# Patient Record
Sex: Male | Born: 1975 | Race: White | Hispanic: No | Marital: Married | Smoking: Never smoker
Health system: Southern US, Community
[De-identification: ages and names within clinical notes are randomized; demographics above are authoritative.]

## PROBLEM LIST (undated history)

## (undated) DIAGNOSIS — R079 Chest pain, unspecified: Secondary | ICD-10-CM

## (undated) DIAGNOSIS — I10 Essential (primary) hypertension: Secondary | ICD-10-CM

## (undated) HISTORY — PX: TONSILLECTOMY: SUR1361

## (undated) HISTORY — PX: COLONOSCOPY: SHX174

## (undated) HISTORY — PX: TONSILECTOMY, ADENOIDECTOMY, BILATERAL MYRINGOTOMY AND TUBES: SHX2538

## (undated) HISTORY — PX: OTHER SURGICAL HISTORY: SHX169

## (undated) HISTORY — PX: HYPOSPADIAS CORRECTION: SHX483

---

## 2014-03-20 ENCOUNTER — Emergency Department (HOSPITAL_BASED_OUTPATIENT_CLINIC_OR_DEPARTMENT_OTHER): Payer: Managed Care, Other (non HMO)

## 2014-03-20 ENCOUNTER — Encounter (HOSPITAL_BASED_OUTPATIENT_CLINIC_OR_DEPARTMENT_OTHER): Payer: Self-pay | Admitting: Emergency Medicine

## 2014-03-20 ENCOUNTER — Emergency Department (HOSPITAL_BASED_OUTPATIENT_CLINIC_OR_DEPARTMENT_OTHER)
Admission: EM | Admit: 2014-03-20 | Discharge: 2014-03-20 | Disposition: A | Discharge status: Home / Self Care | Payer: Managed Care, Other (non HMO) | Attending: Emergency Medicine | Admitting: Emergency Medicine

## 2014-03-20 DIAGNOSIS — X58XXXA Exposure to other specified factors, initial encounter: Secondary | ICD-10-CM | POA: Insufficient documentation

## 2014-03-20 DIAGNOSIS — S6990XA Unspecified injury of unspecified wrist, hand and finger(s), initial encounter: Secondary | ICD-10-CM

## 2014-03-20 DIAGNOSIS — S59909A Unspecified injury of unspecified elbow, initial encounter: Secondary | ICD-10-CM | POA: Diagnosis present

## 2014-03-20 DIAGNOSIS — S63501A Unspecified sprain of right wrist, initial encounter: Secondary | ICD-10-CM

## 2014-03-20 DIAGNOSIS — Y929 Unspecified place or not applicable: Secondary | ICD-10-CM | POA: Insufficient documentation

## 2014-03-20 DIAGNOSIS — Y9389 Activity, other specified: Secondary | ICD-10-CM | POA: Diagnosis not present

## 2014-03-20 DIAGNOSIS — S63509A Unspecified sprain of unspecified wrist, initial encounter: Secondary | ICD-10-CM | POA: Diagnosis not present

## 2014-03-20 DIAGNOSIS — I10 Essential (primary) hypertension: Secondary | ICD-10-CM | POA: Insufficient documentation

## 2014-03-20 DIAGNOSIS — S59919A Unspecified injury of unspecified forearm, initial encounter: Secondary | ICD-10-CM

## 2014-03-20 HISTORY — DX: Essential (primary) hypertension: I10

## 2014-03-20 NOTE — ED Provider Notes (Signed)
CSN: [id number]     Arrival date & time 03/20/14  1620 History   First MD Initiated Contact with Patient 03/20/14 1747     Chief Complaint  Patient presents with  . Wrist Pain     (Consider location/radiation/quality/duration/timing/severity/associated sxs/prior Treatment) Patient is a 38 y.o. male presenting with wrist pain. The history is provided by the patient.  Wrist Pain This is a new problem. The current episode started today. The problem occurs constantly. The problem has been gradually worsening. Associated symptoms include joint swelling and myalgias. Pertinent negatives include no numbness. Nothing aggravates the symptoms. He has tried nothing for the symptoms. The treatment provided moderate relief.    Past Medical History  Diagnosis Date  . Hypertension    History reviewed. No pertinent past surgical history. No family history on file. History  Substance Use Topics  . Smoking status: Never Smoker   . Smokeless tobacco: Not on file  . Alcohol Use: No    Review of Systems  Musculoskeletal: Positive for joint swelling and myalgias.  Neurological: Negative for numbness.  All other systems reviewed and are negative.     Allergies  Review of patient's allergies indicates no known allergies.  Home Medications   Prior to Admission medications   Not on File   Pulse 74  Temp(Src) 98.3 F (36.8 C)  Resp 16  Ht 6' (1.829 m)  Wt 230 lb (104.327 kg)  BMI 31.19 kg/m2  SpO2 100% Physical Exam  Nursing note and vitals reviewed. Constitutional: He appears well-developed and well-nourished.  HENT:  Head: Normocephalic and atraumatic.  Musculoskeletal: He exhibits tenderness.  Swelling to right wrist ulna aspect.  Decreased range of motion,  nv and ns intact  Neurological: He is alert.  Skin: Skin is warm.  Psychiatric: He has a normal mood and affect.    ED Course  Procedures (including critical care time) Labs Review Labs Reviewed - No data to  display  Imaging Review Dg Wrist Complete Right  03/20/2014   CLINICAL DATA:  Right wrist injury and pain.  EXAM: RIGHT WRIST - COMPLETE 3+ VIEW  COMPARISON:  None.  FINDINGS: There is no evidence of fracture or dislocation. There is no evidence of arthropathy or other focal bone abnormality. Soft tissues are unremarkable.  IMPRESSION: Negative.   Electronically Signed   By: Laveda Abbe M.D.   On: 03/20/2014 16:41     EKG Interpretation None      MDM   Final diagnoses:  Wrist sprain, right, initial encounter    Wrist splint Ibuprofen Follow up with Dr. Janee Morn for recheck in 1 week.    Lonia Skinner [place], PA-C 03/20/14 1816

## 2014-03-20 NOTE — ED Provider Notes (Signed)
Medical screening examination/treatment/procedure(s) were performed by non-physician practitioner and as supervising physician I was immediately available for consultation/collaboration.   EKG Interpretation None        Elwin Mocha, MD 03/20/14 2355

## 2014-03-20 NOTE — ED Notes (Signed)
Pt reports pain and swelling to right wrist. Reports catching himself on fall

## 2014-03-20 NOTE — Discharge Instructions (Signed)

## 2016-06-03 ENCOUNTER — Emergency Department (HOSPITAL_BASED_OUTPATIENT_CLINIC_OR_DEPARTMENT_OTHER): Payer: Managed Care, Other (non HMO)

## 2016-06-03 ENCOUNTER — Observation Stay (HOSPITAL_BASED_OUTPATIENT_CLINIC_OR_DEPARTMENT_OTHER)
Admission: EM | Admit: 2016-06-03 | Discharge: 2016-06-04 | Disposition: A | Discharge status: Home / Self Care | Payer: Managed Care, Other (non HMO) | Attending: Internal Medicine | Admitting: Internal Medicine

## 2016-06-03 ENCOUNTER — Encounter (HOSPITAL_BASED_OUTPATIENT_CLINIC_OR_DEPARTMENT_OTHER): Payer: Self-pay | Admitting: Emergency Medicine

## 2016-06-03 DIAGNOSIS — E785 Hyperlipidemia, unspecified: Secondary | ICD-10-CM

## 2016-06-03 DIAGNOSIS — Z7982 Long term (current) use of aspirin: Secondary | ICD-10-CM | POA: Insufficient documentation

## 2016-06-03 DIAGNOSIS — R7303 Prediabetes: Secondary | ICD-10-CM | POA: Insufficient documentation

## 2016-06-03 DIAGNOSIS — I161 Hypertensive emergency: Secondary | ICD-10-CM | POA: Diagnosis not present

## 2016-06-03 DIAGNOSIS — Z8249 Family history of ischemic heart disease and other diseases of the circulatory system: Secondary | ICD-10-CM

## 2016-06-03 DIAGNOSIS — R079 Chest pain, unspecified: Secondary | ICD-10-CM | POA: Diagnosis not present

## 2016-06-03 DIAGNOSIS — R072 Precordial pain: Secondary | ICD-10-CM | POA: Diagnosis not present

## 2016-06-03 DIAGNOSIS — R0602 Shortness of breath: Secondary | ICD-10-CM | POA: Insufficient documentation

## 2016-06-03 DIAGNOSIS — I10 Essential (primary) hypertension: Secondary | ICD-10-CM

## 2016-06-03 HISTORY — DX: Chest pain, unspecified: R07.9

## 2016-06-03 LAB — BASIC METABOLIC PANEL
ANION GAP: 4 — AB (ref 5–15)
BUN: 14 mg/dL (ref 6–20)
CALCIUM: 8.9 mg/dL (ref 8.9–10.3)
CHLORIDE: 107 mmol/L (ref 101–111)
CO2: 28 mmol/L (ref 22–32)
Creatinine, Ser: 1.16 mg/dL (ref 0.61–1.24)
GFR calc Af Amer: >60 mL/min (ref >60)
GFR calc non Af Amer: >60 mL/min (ref >60)
GLUCOSE: 120 mg/dL — AB (ref 65–99)
POTASSIUM: 4.2 mmol/L (ref 3.5–5.1)
Sodium: 139 mmol/L (ref 135–145)

## 2016-06-03 LAB — HEPATIC FUNCTION PANEL
ALBUMIN: 3.8 g/dL (ref 3.5–5.0)
ALT: 30 U/L (ref 17–63)
AST: 22 U/L (ref 15–41)
Alkaline Phosphatase: 45 U/L (ref 38–126)
Bilirubin, Direct: <0.1 mg/dL — ABNORMAL LOW (ref 0.1–0.5)
TOTAL PROTEIN: 6.6 g/dL (ref 6.5–8.1)
Total Bilirubin: 0.9 mg/dL (ref 0.3–1.2)

## 2016-06-03 LAB — CBC
HEMATOCRIT: 40.5 % (ref 39.0–52.0)
HEMOGLOBIN: 13.1 g/dL (ref 13.0–17.0)
MCH: 28.9 pg (ref 26.0–34.0)
MCHC: 32.3 g/dL (ref 30.0–36.0)
MCV: 89.2 fL (ref 78.0–100.0)
Platelets: 160 10*3/uL (ref 150–400)
RBC: 4.54 MIL/uL (ref 4.22–5.81)
RDW: 13.4 % (ref 11.5–15.5)
WBC: 7.4 10*3/uL (ref 4.0–10.5)

## 2016-06-03 LAB — CBC WITH DIFFERENTIAL/PLATELET
Basophils Absolute: 0 10*3/uL (ref 0.0–0.1)
Basophils Relative: 0 %
Eosinophils Absolute: 0.1 10*3/uL (ref 0.0–0.7)
Eosinophils Relative: 2 %
HCT: 43.9 % (ref 39.0–52.0)
HEMOGLOBIN: 14.3 g/dL (ref 13.0–17.0)
LYMPHS PCT: 23 %
Lymphs Abs: 1.5 10*3/uL (ref 0.7–4.0)
MCH: 29 pg (ref 26.0–34.0)
MCHC: 32.6 g/dL (ref 30.0–36.0)
MCV: 89 fL (ref 78.0–100.0)
MONO ABS: 0.4 10*3/uL (ref 0.1–1.0)
MONOS PCT: 7 %
NEUTROS PCT: 68 %
Neutro Abs: 4.5 10*3/uL (ref 1.7–7.7)
Platelets: 161 10*3/uL (ref 150–400)
RBC: 4.93 MIL/uL (ref 4.22–5.81)
RDW: 13.3 % (ref 11.5–15.5)
WBC: 6.5 10*3/uL (ref 4.0–10.5)

## 2016-06-03 LAB — TROPONIN I: Troponin I: <0.03 ng/mL (ref <0.03)

## 2016-06-03 LAB — TSH: TSH: 2.014 u[IU]/mL (ref 0.350–4.500)

## 2016-06-03 LAB — MAGNESIUM: MAGNESIUM: 2 mg/dL (ref 1.7–2.4)

## 2016-06-03 MED ORDER — NITROGLYCERIN 2 % TD OINT
1.0000 [in_us] | TOPICAL_OINTMENT | Freq: Four times a day (QID) | TRANSDERMAL | Status: DC
  Administered 2016-06-03 – 2016-06-04 (×4): 1 [in_us] via TOPICAL
  Filled 2016-06-03 (×2): qty 30

## 2016-06-03 MED ORDER — ASPIRIN EC 81 MG PO TBEC
81.0000 mg | DELAYED_RELEASE_TABLET | Freq: Every day | ORAL | Status: DC
  Administered 2016-06-04: 81 mg via ORAL
  Filled 2016-06-03: qty 1

## 2016-06-03 MED ORDER — HEPARIN SODIUM (PORCINE) 5000 UNIT/ML IJ SOLN
5000.0000 [IU] | Freq: Three times a day (TID) | INTRAMUSCULAR | Status: DC
  Administered 2016-06-03 – 2016-06-04 (×3): 5000 [IU] via SUBCUTANEOUS
  Filled 2016-06-03 (×3): qty 1

## 2016-06-03 MED ORDER — ACETAMINOPHEN 325 MG PO TABS: 650.0000 mg | ORAL_TABLET | ORAL | Status: DC | PRN

## 2016-06-03 MED ORDER — ATORVASTATIN CALCIUM 40 MG PO TABS: 40.0000 mg | ORAL_TABLET | Freq: Every day | ORAL | Status: DC

## 2016-06-03 MED ORDER — ONDANSETRON HCL 4 MG/2ML IJ SOLN: 4.0000 mg | Freq: Four times a day (QID) | INTRAMUSCULAR | Status: DC | PRN

## 2016-06-03 MED ORDER — NITROGLYCERIN 0.4 MG SL SUBL: 0.4000 mg | SUBLINGUAL_TABLET | SUBLINGUAL | Status: DC | PRN

## 2016-06-03 MED ORDER — METOPROLOL TARTRATE 25 MG PO TABS
25.0000 mg | ORAL_TABLET | Freq: Two times a day (BID) | ORAL | Status: DC
  Administered 2016-06-03 – 2016-06-04 (×2): 25 mg via ORAL
  Filled 2016-06-03 (×2): qty 1

## 2016-06-03 MED ORDER — ASPIRIN 81 MG PO CHEW
324.0000 mg | CHEWABLE_TABLET | Freq: Once | ORAL | Status: AC
  Administered 2016-06-03: 324 mg via ORAL
  Filled 2016-06-03: qty 4

## 2016-06-03 MED ORDER — NITROGLYCERIN 0.4 MG SL SUBL
0.4000 mg | SUBLINGUAL_TABLET | SUBLINGUAL | Status: DC | PRN
  Administered 2016-06-03: 0.4 mg via SUBLINGUAL
  Filled 2016-06-03 (×2): qty 1

## 2016-06-03 MED ORDER — PANTOPRAZOLE SODIUM 40 MG PO TBEC
40.0000 mg | DELAYED_RELEASE_TABLET | Freq: Every day | ORAL | Status: DC
  Administered 2016-06-03 – 2016-06-04 (×2): 40 mg via ORAL
  Filled 2016-06-03 (×2): qty 1

## 2016-06-03 NOTE — ED Notes (Signed)
ED Provider at bedside. 

## 2016-06-03 NOTE — ED Triage Notes (Signed)
Patient states that he has had some HBP over the last week. Last night he had some "chest tightness down into my left hand" - this am he called his PMD and they told him to come here. The patient reports that the tightness is better now

## 2016-06-03 NOTE — ED Provider Notes (Signed)
[facility] Provider Note   CSN: [id number] Arrival date & time: 06/03/16  1154     History   Chief Complaint Chief Complaint  Patient presents with  . Chest Pain    HPI Adrian Joyce is a 40 y.o. male.  HPI 40 year old male with past medical history of hypertension and strong family history of coronary disease who presents with chest pain. The patient states that he woke from sleep due to a severe, aching, substernal chest pressure this morning. He had associated shortness of breath. The pain woke him up from sleep. It has mildly improved but not gone away. Denies any previous history of chest pain. Denies any association with exertion. As mentioned, he has a strong family history of coronary disease. He also has hypertension and notes that his blood pressure has been increasing at home lately.  Past Medical History:  Diagnosis Date  . Hypertension     Patient Active Problem List   Diagnosis Date Noted  . Chest pain 06/03/2016    History reviewed. No pertinent surgical history.     Home Medications    Prior to Admission medications   Medication Sig Start Date End Date Taking? Authorizing Provider  omeprazole (PRILOSEC) 10 MG capsule Take 10 mg by mouth daily.   Yes Historical Provider, MD    Family History History reviewed. No pertinent family history.  Social History Social History  Substance Use Topics  . Smoking status: Never Smoker  . Smokeless tobacco: Never Used  . Alcohol use No     Allergies   Patient has no known allergies.   Review of Systems Review of Systems  Constitutional: Negative for chills, fatigue and fever.  HENT: Negative for congestion and rhinorrhea.   Eyes: Negative for visual disturbance.  Respiratory: Positive for chest tightness and shortness of breath. Negative for cough and wheezing.   Cardiovascular: Positive for chest pain. Negative for leg swelling.  Gastrointestinal: Negative for abdominal pain,  diarrhea, nausea and vomiting.  Genitourinary: Negative for dysuria and flank pain.  Musculoskeletal: Negative for neck pain and neck stiffness.  Skin: Negative for rash and wound.  Allergic/Immunologic: Negative for immunocompromised state.  Neurological: Negative for syncope, weakness and headaches.  All other systems reviewed and are negative.    Physical Exam Updated Vital Signs BP 149/88   Pulse 76   Temp 98.5 F (36.9 C) (Oral)   Resp 17   Ht 6' (1.829 m)   Wt 240 lb (108.9 kg)   SpO2 100%   BMI 32.55 kg/m   Physical Exam  Constitutional: He is oriented to person, place, and time. He appears well-developed and well-nourished. No distress.  HENT:  Head: Normocephalic and atraumatic.  Eyes: Conjunctivae are normal.  Neck: Neck supple.  Cardiovascular: Normal rate, regular rhythm and normal heart sounds.  Exam reveals no friction rub.   No murmur heard. Pulmonary/Chest: Effort normal and breath sounds normal. No respiratory distress. He has no wheezes. He has no rales.  Abdominal: He exhibits no distension.  Musculoskeletal: He exhibits no edema.  Neurological: He is alert and oriented to person, place, and time. He exhibits normal muscle tone.  Skin: Skin is warm. Capillary refill takes less than 2 seconds.  Psychiatric: He has a normal mood and affect.  Nursing note and vitals reviewed.    ED Treatments / Results  Labs (all labs ordered are listed, but only abnormal results are displayed) Labs Reviewed  BASIC METABOLIC PANEL - Abnormal; Notable for the following:  Result Value   Glucose, Bld 120 (*)    Anion gap 4 (*)    All other components within normal limits  CBC WITH DIFFERENTIAL/PLATELET  TROPONIN I    EKG  EKG Interpretation  Date/Time:  Monday June 03 2016 11:59:38 EST Ventricular Rate:  72 PR Interval:  146 QRS Duration: 100 QT Interval:  380 QTC Calculation: 416 R Axis:   57 Text Interpretation:  Normal sinus rhythm Normal ECG No  old tracing to compare Confirmed by Dartanyon Frankowski MD, Gleen Ripberger [phone] on 06/03/2016 12:17:04 PM       Radiology Dg Chest 2 View  Result Date: 06/03/2016 CLINICAL DATA:  Chest tightness radiating into left hand EXAM: CHEST  2 VIEW COMPARISON:  None. FINDINGS: The lungs are clear wiithout focal pneumonia, edema, pneumothorax or pleural effusion. Cardiopericardial silhouette is at upper limits of normal for size. The visualized bony structures of the thorax are intact. Telemetry leads overlie the chest. IMPRESSION: No active cardiopulmonary disease. Electronically Signed   By: Kennith CenterEric  Mansell M.D.   On: 06/03/2016 12:48    Procedures Procedures (including critical care time)  Medications Ordered in ED Medications  nitroGLYCERIN (NITROSTAT) SL tablet 0.4 mg (0.4 mg Sublingual Given 06/03/16 1259)  aspirin chewable tablet 324 mg (324 mg Oral Given 06/03/16 1257)     Initial Impression / Assessment and Plan / ED Course  I have reviewed the triage vital signs and the nursing notes.  Pertinent labs & imaging results that were available during my care of the patient were reviewed by me and considered in my medical decision making (see chart for details).  Clinical Course   40 year old male with past medical history of hypertension and strong family history of coronary disease here with typical chest pain. Story concerning for angina and pain has completely resolved with nitroglycerin 1 here in the ED. Otherwise, vital signs are stable. EKG shows no acute ST elevations or depressions. Likely unstable angina. Discussed with Dr. Rennis GoldenHilty of cardiology, who will admit. Patient otherwise well-appearing. Labs unremarkable. Initial troponin is negative. Do not suspect dissection or PE.   Final Clinical Impressions(s) / ED Diagnoses   Final diagnoses:  Chest pain, unspecified type  Essential hypertension  Family history of early CAD    New Prescriptions Current Discharge Medication List       Adrian Joyce  Adrian Barbeau, MD 06/03/16 [phone]

## 2016-06-03 NOTE — H&P (Signed)
Adrian Joyce is an 40 y.o. male.    Primary Cardiologist: Dr. Ernestene Joyce new Adrian Maid, MD  Chief Complaint: chest pain HPI: 78 yom with no cardiac hx presented to [facility] with chest pain after this chest tightness awakened him from sleep.  Also with radiation to Lt hand.  No nausea possible SOB no diaphoresis.  Recent PE with Dr. Daylene Joyce with [facility] system BP was elevated and on home check it has been elevated.  He called Dr. Sherren Joyce and directed to go to ER.   His troponin was neg.  But after ASA and 1 SL NTG pain resolved.  BP was elevated.  Troponin was neg.  EKG SR without acute abnormalities.  He has premature CAD in family with 2 great uncles with MIs at 42.   Currently without pain.    Recent LDL on 05/27/16 was 126, HDL 35, TG 103 T chol 182   Past Medical History:  Diagnosis Date  . Hypertension     Past Surgical History:  Procedure Laterality Date  . HYPOSPADIAS CORRECTION    . myringotomy    . TONSILECTOMY, ADENOIDECTOMY, BILATERAL MYRINGOTOMY AND TUBES      Family History  Problem Relation Age of Onset  . Hypertension Mother   . Diabetes Father   . Heart attack Maternal Uncle 40  . Heart attack Maternal Grandmother   . Heart attack Maternal Uncle 40  . Hypertension Sister    Social History:  reports that he has never smoked. He has never used smokeless tobacco. He reports that he does not drink alcohol or use drugs.  Allergies: No Known Allergies  Medications Prior to Admission  Medication Sig Dispense Refill  . omeprazole (PRILOSEC) 10 MG capsule Take 10 mg by mouth daily.      Results for orders placed or performed during the hospital encounter of 06/03/16 (from the past 48 hour(s))  CBC with Differential     Status: None   Collection Time: 06/03/16 12:29 PM  Result Value Ref Range   WBC 6.5 4.0 - 10.5 K/uL   RBC 4.93 4.22 - 5.81 MIL/uL   Hemoglobin 14.3 13.0 - 17.0 g/dL   HCT 43.9 39.0 - 52.0 %   MCV 89.0 78.0 - 100.0 fL   MCH  29.0 26.0 - 34.0 pg   MCHC 32.6 30.0 - 36.0 g/dL   RDW 13.3 11.5 - 15.5 %   Platelets 161 150 - 400 K/uL   Neutrophils Relative % 68 %   Neutro Abs 4.5 1.7 - 7.7 K/uL   Lymphocytes Relative 23 %   Lymphs Abs 1.5 0.7 - 4.0 K/uL   Monocytes Relative 7 %   Monocytes Absolute 0.4 0.1 - 1.0 K/uL   Eosinophils Relative 2 %   Eosinophils Absolute 0.1 0.0 - 0.7 K/uL   Basophils Relative 0 %   Basophils Absolute 0.0 0.0 - 0.1 K/uL  Basic metabolic panel     Status: Abnormal   Collection Time: 06/03/16 12:29 PM  Result Value Ref Range   Sodium 139 135 - 145 mmol/L   Potassium 4.2 3.5 - 5.1 mmol/L   Chloride 107 101 - 111 mmol/L   CO2 28 22 - 32 mmol/L   Glucose, Bld 120 (H) 65 - 99 mg/dL   BUN 14 6 - 20 mg/dL   Creatinine, Ser 1.16 0.61 - 1.24 mg/dL   Calcium 8.9 8.9 - 10.3 mg/dL   GFR calc non Af Amer >60 >  60 mL/min   GFR calc Af Amer >60 >60 mL/min    Comment: (NOTE) The eGFR has been calculated using the CKD EPI equation. This calculation has not been validated in all clinical situations. eGFR's persistently <60 mL/min signify possible Chronic Kidney Disease.    Anion gap 4 (L) 5 - 15  Troponin I     Status: None   Collection Time: 06/03/16 12:29 PM  Result Value Ref Range   Troponin I <0.03 <0.03 ng/mL   Dg Chest 2 View  Result Date: 06/03/2016 CLINICAL DATA:  Chest tightness radiating into left hand EXAM: CHEST  2 VIEW COMPARISON:  None. FINDINGS: The lungs are clear wiithout focal pneumonia, edema, pneumothorax or pleural effusion. Cardiopericardial silhouette is at upper limits of normal for size. The visualized bony structures of the thorax are intact. Telemetry leads overlie the chest. IMPRESSION: No active cardiopulmonary disease. Electronically Signed   By: Misty Stanley M.D.   On: 06/03/2016 12:48    ROS: General:no colds or fevers, no weight changes Skin:no rashes or ulcers HEENT:no blurred vision, no congestion CV:see HPI PUL:see HPI GI:no diarrhea constipation  or melena, no indigestion GU:no hematuria, no dysuria MS:no joint pain, no claudication Neuro:no syncope, no lightheadedness Endo:no diabetes, no thyroid disease   Blood pressure (!) 160/102, pulse 72, temperature 98.9 F (37.2 C), temperature source Oral, resp. rate 18, height 6' (1.829 m), weight 240 lb (108.9 kg), SpO2 99 %. PE: General:Pleasant affect, NAD Skin:Warm and dry, brisk capillary refill HEENT:normocephalic, sclera clear, mucus membranes moist Neck:supple, no JVD, no bruits  Heart:S1S2 RRR without murmur, gallup, rub or click Lungs:clear without rales, rhonchi, or wheezes EBX:IDHW, non tender, + BS, do not palpate liver spleen or masses Ext:no lower ext edema, 2+ pedal pulses, 2+ radial pulses Neuro:alert and oriented X 3, MAE, follows commands, + facial symmetry    Assessment/Plan 1. Chest pain, with premature FH of CAD, that woke him from sleep and was relieved with SL NTG.  Will do serial troponins - hold Heparin unless troponin +. Have added statin and BB- discussed possible cardiac cath with pt.   2.  HTN I have added NTG paste, and lopressor 25 mg BID.  Will monitor.     3. VTE prophylaxis with subcu heparin unless troponin +.   Adrian Joyce Nurse Practitioner Certified [place] Pager [phone] or after 5pm or weekends call [phone] 06/03/2016, 6:08 PM

## 2016-06-03 NOTE — ED Notes (Signed)
Patient reports relief from chest pain

## 2016-06-03 NOTE — ED Notes (Signed)
Attempted to call report. Nurse to call back. 

## 2016-06-04 ENCOUNTER — Observation Stay (HOSPITAL_COMMUNITY): Payer: Managed Care, Other (non HMO)

## 2016-06-04 ENCOUNTER — Encounter (HOSPITAL_COMMUNITY): Payer: Self-pay | Admitting: General Practice

## 2016-06-04 DIAGNOSIS — E785 Hyperlipidemia, unspecified: Secondary | ICD-10-CM | POA: Diagnosis not present

## 2016-06-04 DIAGNOSIS — I161 Hypertensive emergency: Secondary | ICD-10-CM | POA: Diagnosis not present

## 2016-06-04 DIAGNOSIS — R079 Chest pain, unspecified: Secondary | ICD-10-CM | POA: Diagnosis not present

## 2016-06-04 LAB — BASIC METABOLIC PANEL
Anion gap: 5 (ref 5–15)
BUN: 13 mg/dL (ref 6–20)
CHLORIDE: 109 mmol/L (ref 101–111)
CO2: 26 mmol/L (ref 22–32)
CREATININE: 1.13 mg/dL (ref 0.61–1.24)
Calcium: 8.7 mg/dL — ABNORMAL LOW (ref 8.9–10.3)
GFR calc Af Amer: >60 mL/min (ref >60)
GFR calc non Af Amer: >60 mL/min (ref >60)
Glucose, Bld: 144 mg/dL — ABNORMAL HIGH (ref 65–99)
Potassium: 4.4 mmol/L (ref 3.5–5.1)
SODIUM: 140 mmol/L (ref 135–145)

## 2016-06-04 LAB — CBC
HCT: 41.5 % (ref 39.0–52.0)
Hemoglobin: 13.5 g/dL (ref 13.0–17.0)
MCH: 29.2 pg (ref 26.0–34.0)
MCHC: 32.5 g/dL (ref 30.0–36.0)
MCV: 89.6 fL (ref 78.0–100.0)
PLATELETS: 151 10*3/uL (ref 150–400)
RBC: 4.63 MIL/uL (ref 4.22–5.81)
RDW: 13.7 % (ref 11.5–15.5)
WBC: 6.3 10*3/uL (ref 4.0–10.5)

## 2016-06-04 LAB — CREATININE, SERUM: CREATININE: 1.18 mg/dL (ref 0.61–1.24)

## 2016-06-04 LAB — TROPONIN I

## 2016-06-04 LAB — PROTIME-INR
INR: 1.05
Prothrombin Time: 13.7 seconds (ref 11.4–15.2)

## 2016-06-04 LAB — HEMOGLOBIN A1C
HEMOGLOBIN A1C: 6.3 % — AB (ref 4.8–5.6)
Mean Plasma Glucose: 134 mg/dL

## 2016-06-04 MED ORDER — METOPROLOL TARTRATE 25 MG PO TABS: 25.0000 mg | ORAL_TABLET | Freq: Two times a day (BID) | ORAL | 5 refills | Status: AC

## 2016-06-04 MED ORDER — METOPROLOL TARTRATE 25 MG PO TABS: 25.0000 mg | ORAL_TABLET | Freq: Two times a day (BID) | ORAL | 5 refills | Status: DC

## 2016-06-04 MED ORDER — IOPAMIDOL (ISOVUE-370) INJECTION 76%
INTRAVENOUS | Status: AC
  Administered 2016-06-04: 80 mL
  Filled 2016-06-04: qty 100

## 2016-06-04 NOTE — Discharge Summary (Signed)
Discharge Summary    Patient ID: FITZHUGH VIZCARRONDO,  MRN: [id number], DOB/AGE: [birth year] 40 y.o.  Admit date: 06/03/2016 Discharge date: 06/04/2016  Primary Care Provider: [facility] Primary Cardiologist: Dr. Rennis Golden   Discharge Diagnoses    Active Problems:   Chest pain   Chest pain at rest   Allergies No Known Allergies  Diagnostic Studies/Procedures   Cardiac/Coronary CT 06/04/16 IMPRESSION: 1. Coronary calcium score of 0. This was 0 percentile for age and sex matched control.  2. Normal coronary origin with right dominance.  3. No evidence of CAD.  4. Moderate concentric left ventricular hypertrophy.    History of Present Illness     Pleasant 40 yo male with a history of recent URI symptoms, noted to be hypertensive in PCP's office. No treatment recommended at the time. Subsequently, developed SSCP and left arm pain which awakened him from sleep, prompting a call to his PCP who directed him to the ER. He presented night of 06/03/16. Pain was nitrate responsive in ED and resolved. Patient admitted for further cardiac w/u.   Hospital Course     He was admitted to telemetry. He was placed on BB therapy with metoprolol 25 mg BID. BP improved. Cardiac enzymes were cycled and negative x 3. He had no further CP. He was further evaluated with a cardiac/ coronary CTA. This was a normal study with normal coronary origin with right dominance, no evidence of CAD. Coronary calcium score was 0. Moderate concentric LVH was noted. Theses results were called in to Dr. Rennis Golden. He determined the patient was stable for d/c home. He will f/u with his PCP who will manage his HTN and monitor his glucose levels (pre-diabetic with Hgb A1c of 6.3). Pt was given a Rx for metoprolol, 25 mg BID.   Consultants: none    Discharge Vitals Blood pressure 133/87, pulse 62, temperature 98.2 F (36.8 C), temperature source Oral, resp. rate 20, height 6' (1.829 m), weight 240 lb (108.9 kg),  SpO2 97 %.  Filed Weights   06/03/16 1213  Weight: 240 lb (108.9 kg)    Labs & Radiologic Studies    CBC  Recent Labs  06/03/16 1229 06/03/16 2330 06/04/16 0539  WBC 6.5 7.4 6.3  NEUTROABS 4.5  --   --   HGB 14.3 13.1 13.5  HCT 43.9 40.5 41.5  MCV 89.0 89.2 89.6  PLT 161 160 151   Basic Metabolic Panel  Recent Labs  06/03/16 1229 06/03/16 1805 06/03/16 2330 06/04/16 0539  NA 139  --   --  140  K 4.2  --   --  4.4  CL 107  --   --  109  CO2 28  --   --  26  GLUCOSE 120*  --   --  144*  BUN 14  --   --  13  CREATININE 1.16  --  1.18 1.13  CALCIUM 8.9  --   --  8.7*  MG  --  2.0  --   --    Liver Function Tests  Recent Labs  06/03/16 1805  AST 22  ALT 30  ALKPHOS 45  BILITOT 0.9  PROT 6.6  ALBUMIN 3.8   No results for input(s): LIPASE, AMYLASE in the last 72 hours. Cardiac Enzymes  Recent Labs  06/03/16 1805 06/03/16 2330 06/04/16 0539  TROPONINI <0.03 <0.03 <0.03   BNP Invalid input(s): POCBNP D-Dimer No results for input(s): DDIMER in the last 72 hours. Hemoglobin A1C  Recent Labs  06/03/16 1805  HGBA1C 6.3*   Fasting Lipid Panel No results for input(s): CHOL, HDL, LDLCALC, TRIG, CHOLHDL, LDLDIRECT in the last 72 hours. Thyroid Function Tests  Recent Labs  06/03/16 1805  TSH 2.014   _____________  Dg Chest 2 View  Result Date: 06/03/2016 CLINICAL DATA:  Chest tightness radiating into left hand EXAM: CHEST  2 VIEW COMPARISON:  None. FINDINGS: The lungs are clear wiithout focal pneumonia, edema, pneumothorax or pleural effusion. Cardiopericardial silhouette is at upper limits of normal for size. The visualized bony structures of the thorax are intact. Telemetry leads overlie the chest. IMPRESSION: No active cardiopulmonary disease. Electronically Signed   By: Kennith CenterEric  Mansell M.D.   On: 06/03/2016 12:48   Ct Coronary Morph W/cta Cor W/score W/ca W/cm &/or Wo/cm  Addendum Date: 06/04/2016   ADDENDUM REPORT: 06/04/2016 14:44 CLINICAL  DATA:  40 year old male with h/o hypertension, family h/o premature CAD and chest pain. EXAM: Cardiac/Coronary  CT TECHNIQUE: The patient was scanned on a Philips 256 scanner. FINDINGS: A 120 kV prospective scan was triggered in the descending thoracic aorta at 111 HU's. Axial non-contrast 3 mm slices were carried out through the heart. The data set was analyzed on a dedicated work station and scored using the Agatson method. Gantry rotation speed was 270 msecs and collimation was .9 mm. 5 mg of iv metoprolol and 0.8 mg of sl NTG was given. The 3D data set was reconstructed in 5% intervals of the 67-82 % of the R-R cycle. Diastolic phases were analyzed on a dedicated work station using MPR, MIP and VRT modes. The patient received 80 cc of contrast. Aorta:  Normal size.  No calcifications.  No dissection. Aortic Valve:  Trileaflet.  No calcifications. Coronary Arteries:  Normal coronary origin.  Right dominance. RCA is a large dominant artery that gives rise to PDA and PLVB. There is no plaque. PDA is not seen well in the proximal portion. Left main is a large artery that gives rise to LAD, ramus intermedius and LCX arteries. LAD is a large vessel that wraps around the apex and gives rise to one diagonal branch, there is no plaque. RI is a small artery with no plaque. LCX is a small non-dominant artery that gives rise to one OM1 branch. There is no plaque. Other findings: Normal pulmonary vein drainage into the left atrium. Normal let atrial appendage without a thrombus. Normal caliber of the pulmonary artery. Moderate concentric left ventricular hypertrophy. IMPRESSION: 1. Coronary calcium score of 0. This was 0 percentile for age and sex matched control. 2. Normal coronary origin with right dominance. 3. No evidence of CAD. 4. Moderate concentric left ventricular hypertrophy. Tobias AlexanderKatarina Nelson Electronically Signed   By: Tobias AlexanderKatarina  Nelson   On: 06/04/2016 14:44   Result Date: 06/04/2016 EXAM: OVER-READ  INTERPRETATION  CT CHEST The following report is an over-read performed by radiologist Dr. Noe GensKevin [facility] on 06/04/2016. This over-read does not include interpretation of cardiac or coronary anatomy or pathology. The coronary calcium score/coronary CTA interpretation by the cardiologist is attached. COMPARISON:  None. FINDINGS: No adenopathy in the visualized lower mediastinum or hila. Dependent atelectasis in the left base. No confluent opacities in the visualized lungs otherwise. No pleural effusions. No bony abnormality. IMPRESSION: No acute or significant extracardiac abnormality. Minimal dependent atelectasis in the left lung base. Electronically Signed: By: Charlett NoseKevin  Dover M.D. On: 06/04/2016 13:58   Disposition   Pt is being discharged home today in good condition.  Follow-up Plans & Appointments    Follow-up Information    TODD,ELIZABETH, FNP Follow up in 1 week(s).   Specialty:  Nurse Practitioner Contact information: [address] [place] [facility] [place] [address] [phone]          Discharge Instructions    Diet - low sodium heart healthy    Complete by:  As directed    Increase activity slowly    Complete by:  As directed       Discharge Medications   Current Discharge Medication List    START taking these medications   Details  metoprolol tartrate (LOPRESSOR) 25 MG tablet Take 1 tablet (25 mg total) by mouth 2 (two) times daily. Qty: 60 tablet, Refills: 5      CONTINUE these medications which have NOT CHANGED   Details  aspirin 81 MG chewable tablet Chew 324 mg by mouth daily.    esomeprazole (NEXIUM) 10 MG packet Take 10 mg by mouth daily as needed.      STOP taking these medications     nitroGLYCERIN (NITROSTAT) 0.4 MG SL tablet      omeprazole (PRILOSEC) 10 MG capsule           Outstanding Labs/Studies   None   Duration of Discharge Encounter   Greater than 30 minutes including physician  time.  Signed, Robbie LisBrittainy Simmons PA-C 06/04/2016, 4:44 PM

## 2016-06-04 NOTE — Progress Notes (Signed)
Patient Name: Adrian Joyce Date of Encounter: 06/04/2016  Primary Cardiologist[facility]  Hospital Problem List     Active Problems:   Chest pain   Chest pain at rest    Patient Profile     Pleasant 40 yo male with a history of recent URI symptoms, noted to be hypertensive in PCP's office. No treatment recommended at the time. Subsequently, developed SSCP and left arm pain which awakened him from sleep, prompting a call to his PCP who directed him to the ER. Pain nitrate responsive. Patient admitted for further cardiac w/u. Enzymes negative x 3.   Subjective   Feels better this am. No recurrent CP.   Inpatient Medications    Scheduled Meds: . aspirin EC  81 mg Oral Daily  . atorvastatin  40 mg Oral q1800  . heparin  5,000 Units Subcutaneous Q8H  . metoprolol tartrate  25 mg Oral BID  . nitroGLYCERIN  1 inch Topical Q6H  . pantoprazole  40 mg Oral Daily   Continuous Infusions:  PRN Meds: acetaminophen, nitroGLYCERIN, nitroGLYCERIN, ondansetron (ZOFRAN) IV   Vital Signs    Vitals:   06/03/16 1648 06/03/16 1928 06/03/16 2157 06/04/16 0608  BP: (!) 160/102 (!) 171/94 (!) 143/74 132/80  Pulse: 72 90 68 69  Resp: 18 20  18   Temp: 98.9 F (37.2 C) 98.3 F (36.8 C)  98.1 F (36.7 C)  TempSrc: Oral Oral  Oral  SpO2: 99% 97%  99%  Weight:      Height:        Intake/Output Summary (Last 24 hours) at 06/04/16 1028 Last data filed at 06/04/16 0800  Gross per 24 hour  Intake                0 ml  Output                0 ml  Net                0 ml   Filed Weights   06/03/16 1213  Weight: 240 lb (108.9 kg)    Physical Exam   GEN: Well nourished, well developed, in no acute distress.  HEENT: Grossly normal.  Neck: Supple, no JVD, carotid bruits, or masses. Cardiac: RRR, no murmurs, rubs, or gallops. No clubbing, cyanosis, edema.  Radials/DP/PT 2+ and equal bilaterally.  Respiratory:  Respirations regular and unlabored, clear to auscultation  bilaterally. GI: Soft, nontender, nondistended, BS + x 4. MS: no deformity or atrophy. Skin: warm and dry, no rash. Neuro:  Strength and sensation are intact. Psych: AAOx3.  Normal affect.  Labs    CBC  Recent Labs  06/03/16 1229 06/03/16 2330 06/04/16 0539  WBC 6.5 7.4 6.3  NEUTROABS 4.5  --   --   HGB 14.3 13.1 13.5  HCT 43.9 40.5 41.5  MCV 89.0 89.2 89.6  PLT 161 160 151   Basic Metabolic Panel  Recent Labs  06/03/16 1229 06/03/16 1805 06/03/16 2330 06/04/16 0539  NA 139  --   --  140  K 4.2  --   --  4.4  CL 107  --   --  109  CO2 28  --   --  26  GLUCOSE 120*  --   --  144*  BUN 14  --   --  13  CREATININE 1.16  --  1.18 1.13  CALCIUM 8.9  --   --  8.7*  MG  --  2.0  --   --  Liver Function Tests  Recent Labs  06/03/16 1805  AST 22  ALT 30  ALKPHOS 45  BILITOT 0.9  PROT 6.6  ALBUMIN 3.8   No results for input(s): LIPASE, AMYLASE in the last 72 hours. Cardiac Enzymes  Recent Labs  06/03/16 1805 06/03/16 2330 06/04/16 0539  TROPONINI <0.03 <0.03 <0.03   BNP Invalid input(s): POCBNP D-Dimer No results for input(s): DDIMER in the last 72 hours. Hemoglobin A1C  Recent Labs  06/03/16 1805  HGBA1C 6.3*   Fasting Lipid Panel No results for input(s): CHOL, HDL, LDLCALC, TRIG, CHOLHDL, LDLDIRECT in the last 72 hours. Thyroid Function Tests  Recent Labs  06/03/16 1805  TSH 2.014    Telemetry     - Personally Reviewed  ECG    EKG 06/04/16 NSR. 60 bpm  - Personally Reviewed  Radiology    Dg Chest 2 View  Result Date: 06/03/2016 CLINICAL DATA:  Chest tightness radiating into left hand EXAM: CHEST  2 VIEW COMPARISON:  None. FINDINGS: The lungs are clear wiithout focal pneumonia, edema, pneumothorax or pleural effusion. Cardiopericardial silhouette is at upper limits of normal for size. The visualized bony structures of the thorax are intact. Telemetry leads overlie the chest. IMPRESSION: No active cardiopulmonary disease.  Electronically Signed   By: Kennith CenterEric  Mansell M.D.   On: 06/03/2016 12:48    Cardiac Studies   Cardiac Panel (last 3 results)  Recent Labs  06/03/16 1805 06/03/16 2330 06/04/16 0539  TROPONINI <0.03 <0.03 <0.03     Patient Profile     Pleasant 40 yo male with a history of recent URI symptoms, noted to be hypertensive in PCP's office. No treatment recommended at the time. Subsequently, developed SSCP and left arm pain which awakened him from sleep, prompting a call to his PCP who directed him to the ER. Pain nitrate responsive. He has ruled out for MI with negative cardiac enzymes x 3.   Assessment & Plan    1. Chest Pain: he has ruled out for MI with negative cardiac enzymes x 3. 2D echo pending. CP resolved. BP improved. Will risk assess with a coronary CT. He is on BB therapy with metoprolol with resting HR in the 70s. He just received his am dose ~15 min ago. Can use low dose IV metoprolol, if needed in radiology, prior to CT to get HR in the low 60s. Will need 18 gauge IV.   2. Hypertension: BP better controlled this am at 132/80 after addition of metoprolol, 25 mg BID. HR is stable in the 70s.   3. Prediabetes: Hgb A1c is 6.3. Pt advised to f/u with PCP for further monitoring. Weight loss, increased physical activity and diet advised.   Signed, Robbie LisBrittainy Simmons, PA-C  06/04/2016, 10:28 AM

## 2017-11-16 IMAGING — CT CT HEART MORP W/ CTA COR W/ SCORE W/ CA W/CM &/OR W/O CM
1 of 10 series · 1 of 20 positions shown, 2 images · IV contrast (Iodine)
Comparison: None.

CLINICAL DATA: 40-year-old male with h/o hypertension, family h/o
premature CAD and chest pain.

EXAM:
Cardiac/Coronary  CT
TECHNIQUE: The patient was scanned on a Philips [id number] scanner.

[Series 300: locator · axial · 0.35mm/px · z∈[+934,+934]mm · 1 of 1 slices shown, 2 images]
[im 1/1  vessel]
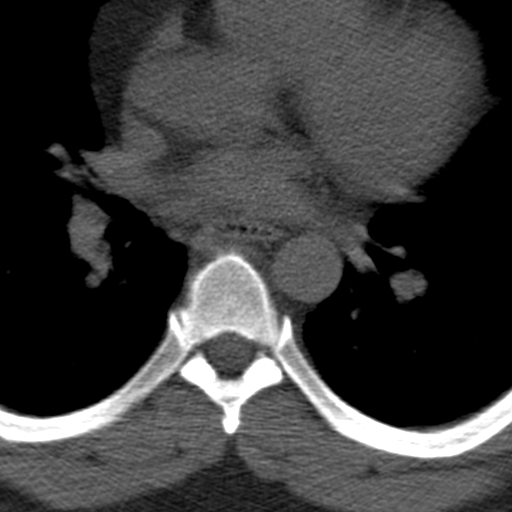
[im 1/1  lung]
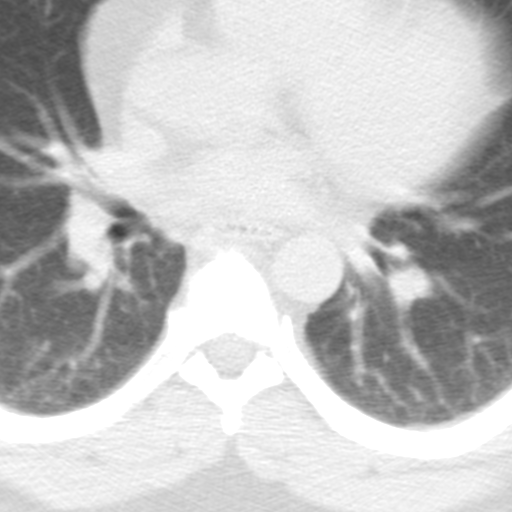

[1 of 20 positions shown; findings below may reference images not displayed]

FINDINGS: A 120 kV prospective scan was triggered in the descending thoracic
aorta at 111 HU's. Axial non-contrast 3 mm slices were carried out
through the heart. The data set was analyzed on a dedicated work
station and scored using the Agatson method. Gantry rotation speed
was 270 msecs and collimation was .9 mm. 5 mg of iv metoprolol and
0.8 mg of sl NTG was given. The 3D data set was reconstructed in 5%
intervals of the 67-82 % of the R-R cycle. Diastolic phases were
analyzed on a dedicated work station using MPR, MIP and VRT modes.
The patient received 80 cc of contrast.

Aorta:  Normal size.  No calcifications.  No dissection.

Aortic Valve:  Trileaflet.  No calcifications.

Coronary Arteries:  Normal coronary origin.  Right dominance.

RCA is a large dominant artery that gives rise to PDA and PLVB.
There is no plaque. PDA is not seen well in the proximal portion.

Left main is a large artery that gives rise to LAD, ramus
intermedius and LCX arteries.

LAD is a large vessel that wraps around the apex and gives rise to
one diagonal branch, there is no plaque.

RI is a small artery with no plaque.

LCX is a small non-dominant artery that gives rise to one OM1
branch. There is no plaque.

Other findings:

Normal pulmonary vein drainage into the left atrium.

Normal let atrial appendage without a thrombus.

Normal caliber of the pulmonary artery.

Moderate concentric left ventricular hypertrophy.
IMPRESSION: 1. Coronary calcium score of 0. This was 0 percentile for age and
sex matched control.

2. Normal coronary origin with right dominance.

3. No evidence of CAD.

4. Moderate concentric left ventricular hypertrophy.

Uneysh Ladarey

EXAM:
OVER-READ INTERPRETATION  CT CHEST

The following report is an over-read performed by radiologist Dr.
Tornke Telia [[id number]] on 06/04/2016. This over-read
does not include interpretation of cardiac or coronary anatomy or
pathology. The coronary calcium score/coronary CTA interpretation by
the cardiologist is attached.
FINDINGS: No adenopathy in the visualized lower mediastinum or hila. Dependent
atelectasis in the left base. No confluent opacities in the
visualized lungs otherwise. No pleural effusions. No bony
abnormality.
IMPRESSION: No acute or significant extracardiac abnormality. Minimal dependent
atelectasis in the left lung base.
